# Patient Record
Sex: Male | Born: 1995
Health system: Southern US, Community
[De-identification: ages and names within clinical notes are randomized; demographics above are authoritative.]

## PROBLEM LIST (undated history)

## (undated) DIAGNOSIS — H209 Unspecified iridocyclitis: Secondary | ICD-10-CM

## (undated) DIAGNOSIS — H35359 Cystoid macular degeneration, unspecified eye: Secondary | ICD-10-CM

## (undated) DIAGNOSIS — IMO0002 Reserved for concepts with insufficient information to code with codable children: Secondary | ICD-10-CM

## (undated) DIAGNOSIS — J45909 Unspecified asthma, uncomplicated: Secondary | ICD-10-CM

## (undated) HISTORY — DX: Cystoid macular degeneration, unspecified eye: H35.359

## (undated) HISTORY — DX: Reserved for concepts with insufficient information to code with codable children: IMO0002

## (undated) HISTORY — DX: Unspecified iridocyclitis: H20.9

## (undated) HISTORY — PX: APPENDECTOMY: SHX54

## (undated) HISTORY — PX: EYE SURGERY: SHX253

---
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
## 2017-10-19 DIAGNOSIS — H3020 Posterior cyclitis, unspecified eye: Secondary | ICD-10-CM | POA: Diagnosis not present

## 2017-10-19 DIAGNOSIS — H209 Unspecified iridocyclitis: Secondary | ICD-10-CM | POA: Diagnosis not present

## 2017-11-18 DIAGNOSIS — H209 Unspecified iridocyclitis: Secondary | ICD-10-CM | POA: Diagnosis not present

## 2017-11-18 DIAGNOSIS — H3020 Posterior cyclitis, unspecified eye: Secondary | ICD-10-CM | POA: Diagnosis not present

## 2017-11-19 DIAGNOSIS — H3023 Posterior cyclitis, bilateral: Secondary | ICD-10-CM | POA: Diagnosis not present

## 2017-11-19 DIAGNOSIS — H35373 Puckering of macula, bilateral: Secondary | ICD-10-CM | POA: Diagnosis not present

## 2017-11-19 DIAGNOSIS — H209 Unspecified iridocyclitis: Secondary | ICD-10-CM | POA: Diagnosis not present

## 2017-11-19 DIAGNOSIS — H35353 Cystoid macular degeneration, bilateral: Secondary | ICD-10-CM | POA: Diagnosis not present

## 2017-12-16 DIAGNOSIS — H209 Unspecified iridocyclitis: Secondary | ICD-10-CM | POA: Diagnosis not present

## 2017-12-16 DIAGNOSIS — H3023 Posterior cyclitis, bilateral: Secondary | ICD-10-CM | POA: Diagnosis not present

## 2017-12-16 DIAGNOSIS — H3020 Posterior cyclitis, unspecified eye: Secondary | ICD-10-CM | POA: Diagnosis not present

## 2018-01-13 DIAGNOSIS — H209 Unspecified iridocyclitis: Secondary | ICD-10-CM | POA: Diagnosis not present

## 2018-01-13 DIAGNOSIS — H3023 Posterior cyclitis, bilateral: Secondary | ICD-10-CM | POA: Diagnosis not present

## 2018-01-13 DIAGNOSIS — H3020 Posterior cyclitis, unspecified eye: Secondary | ICD-10-CM | POA: Diagnosis not present

## 2018-01-17 DIAGNOSIS — Z79899 Other long term (current) drug therapy: Secondary | ICD-10-CM | POA: Diagnosis not present

## 2018-01-17 DIAGNOSIS — H2013 Chronic iridocyclitis, bilateral: Secondary | ICD-10-CM | POA: Diagnosis not present

## 2018-01-25 DIAGNOSIS — H40013 Open angle with borderline findings, low risk, bilateral: Secondary | ICD-10-CM | POA: Diagnosis not present

## 2018-01-25 DIAGNOSIS — H25013 Cortical age-related cataract, bilateral: Secondary | ICD-10-CM | POA: Diagnosis not present

## 2018-01-25 DIAGNOSIS — H209 Unspecified iridocyclitis: Secondary | ICD-10-CM | POA: Diagnosis not present

## 2018-02-10 DIAGNOSIS — H209 Unspecified iridocyclitis: Secondary | ICD-10-CM | POA: Diagnosis not present

## 2018-02-10 DIAGNOSIS — H3020 Posterior cyclitis, unspecified eye: Secondary | ICD-10-CM | POA: Diagnosis not present

## 2018-03-10 DIAGNOSIS — H209 Unspecified iridocyclitis: Secondary | ICD-10-CM | POA: Diagnosis not present

## 2018-03-10 DIAGNOSIS — H3020 Posterior cyclitis, unspecified eye: Secondary | ICD-10-CM | POA: Diagnosis not present

## 2018-03-18 DIAGNOSIS — H25013 Cortical age-related cataract, bilateral: Secondary | ICD-10-CM | POA: Diagnosis not present

## 2018-03-18 DIAGNOSIS — H35353 Cystoid macular degeneration, bilateral: Secondary | ICD-10-CM | POA: Diagnosis not present

## 2018-03-18 DIAGNOSIS — H35373 Puckering of macula, bilateral: Secondary | ICD-10-CM | POA: Diagnosis not present

## 2018-03-18 DIAGNOSIS — H209 Unspecified iridocyclitis: Secondary | ICD-10-CM | POA: Diagnosis not present

## 2018-03-18 DIAGNOSIS — H3023 Posterior cyclitis, bilateral: Secondary | ICD-10-CM | POA: Diagnosis not present

## 2018-04-04 DIAGNOSIS — Z79899 Other long term (current) drug therapy: Secondary | ICD-10-CM | POA: Diagnosis not present

## 2018-04-11 DIAGNOSIS — H209 Unspecified iridocyclitis: Secondary | ICD-10-CM | POA: Diagnosis not present

## 2018-04-11 DIAGNOSIS — H3023 Posterior cyclitis, bilateral: Secondary | ICD-10-CM | POA: Diagnosis not present

## 2018-05-10 DIAGNOSIS — H209 Unspecified iridocyclitis: Secondary | ICD-10-CM | POA: Diagnosis not present

## 2018-05-10 DIAGNOSIS — H3020 Posterior cyclitis, unspecified eye: Secondary | ICD-10-CM | POA: Diagnosis not present

## 2018-06-07 DIAGNOSIS — T63483A Toxic effect of venom of other arthropod, assault, initial encounter: Secondary | ICD-10-CM | POA: Diagnosis not present

## 2018-06-09 DIAGNOSIS — H3020 Posterior cyclitis, unspecified eye: Secondary | ICD-10-CM | POA: Diagnosis not present

## 2018-06-09 DIAGNOSIS — H209 Unspecified iridocyclitis: Secondary | ICD-10-CM | POA: Diagnosis not present

## 2018-07-08 DIAGNOSIS — H209 Unspecified iridocyclitis: Secondary | ICD-10-CM | POA: Diagnosis not present

## 2018-07-08 DIAGNOSIS — H3020 Posterior cyclitis, unspecified eye: Secondary | ICD-10-CM | POA: Diagnosis not present

## 2018-07-18 DIAGNOSIS — Z79899 Other long term (current) drug therapy: Secondary | ICD-10-CM | POA: Diagnosis not present

## 2018-07-18 DIAGNOSIS — H209 Unspecified iridocyclitis: Secondary | ICD-10-CM | POA: Diagnosis not present

## 2018-07-22 DIAGNOSIS — H25013 Cortical age-related cataract, bilateral: Secondary | ICD-10-CM | POA: Diagnosis not present

## 2018-07-22 DIAGNOSIS — H3023 Posterior cyclitis, bilateral: Secondary | ICD-10-CM | POA: Diagnosis not present

## 2018-07-22 DIAGNOSIS — H35353 Cystoid macular degeneration, bilateral: Secondary | ICD-10-CM | POA: Diagnosis not present

## 2018-07-22 DIAGNOSIS — H35373 Puckering of macula, bilateral: Secondary | ICD-10-CM | POA: Diagnosis not present

## 2018-08-15 DIAGNOSIS — H3020 Posterior cyclitis, unspecified eye: Secondary | ICD-10-CM | POA: Diagnosis not present

## 2018-08-15 DIAGNOSIS — H209 Unspecified iridocyclitis: Secondary | ICD-10-CM | POA: Diagnosis not present

## 2018-09-20 DIAGNOSIS — H35373 Puckering of macula, bilateral: Secondary | ICD-10-CM | POA: Diagnosis not present

## 2018-09-20 DIAGNOSIS — H3020 Posterior cyclitis, unspecified eye: Secondary | ICD-10-CM | POA: Diagnosis not present

## 2018-09-20 DIAGNOSIS — H3023 Posterior cyclitis, bilateral: Secondary | ICD-10-CM | POA: Diagnosis not present

## 2018-09-20 DIAGNOSIS — H209 Unspecified iridocyclitis: Secondary | ICD-10-CM | POA: Diagnosis not present

## 2018-10-27 DIAGNOSIS — H209 Unspecified iridocyclitis: Secondary | ICD-10-CM | POA: Diagnosis not present

## 2018-10-27 DIAGNOSIS — H3020 Posterior cyclitis, unspecified eye: Secondary | ICD-10-CM | POA: Diagnosis not present

## 2018-10-31 DIAGNOSIS — Z79899 Other long term (current) drug therapy: Secondary | ICD-10-CM | POA: Diagnosis not present

## 2018-10-31 DIAGNOSIS — H209 Unspecified iridocyclitis: Secondary | ICD-10-CM | POA: Diagnosis not present

## 2018-11-25 DIAGNOSIS — H35353 Cystoid macular degeneration, bilateral: Secondary | ICD-10-CM | POA: Diagnosis not present

## 2018-11-25 DIAGNOSIS — H25013 Cortical age-related cataract, bilateral: Secondary | ICD-10-CM | POA: Diagnosis not present

## 2018-11-25 DIAGNOSIS — H35373 Puckering of macula, bilateral: Secondary | ICD-10-CM | POA: Diagnosis not present

## 2018-11-25 DIAGNOSIS — H3023 Posterior cyclitis, bilateral: Secondary | ICD-10-CM | POA: Diagnosis not present

## 2018-11-29 DIAGNOSIS — H209 Unspecified iridocyclitis: Secondary | ICD-10-CM | POA: Diagnosis not present

## 2018-11-29 DIAGNOSIS — H3020 Posterior cyclitis, unspecified eye: Secondary | ICD-10-CM | POA: Diagnosis not present

## 2019-01-03 DIAGNOSIS — H3023 Posterior cyclitis, bilateral: Secondary | ICD-10-CM | POA: Diagnosis not present

## 2019-01-03 DIAGNOSIS — H35373 Puckering of macula, bilateral: Secondary | ICD-10-CM | POA: Diagnosis not present

## 2019-01-03 DIAGNOSIS — H3020 Posterior cyclitis, unspecified eye: Secondary | ICD-10-CM | POA: Diagnosis not present

## 2019-01-03 DIAGNOSIS — H209 Unspecified iridocyclitis: Secondary | ICD-10-CM | POA: Diagnosis not present

## 2019-02-07 DIAGNOSIS — H209 Unspecified iridocyclitis: Secondary | ICD-10-CM | POA: Diagnosis not present

## 2019-02-07 DIAGNOSIS — H3020 Posterior cyclitis, unspecified eye: Secondary | ICD-10-CM | POA: Diagnosis not present

## 2019-03-14 DIAGNOSIS — H209 Unspecified iridocyclitis: Secondary | ICD-10-CM | POA: Diagnosis not present

## 2019-03-20 DIAGNOSIS — H209 Unspecified iridocyclitis: Secondary | ICD-10-CM | POA: Diagnosis not present

## 2019-04-19 DIAGNOSIS — H35373 Puckering of macula, bilateral: Secondary | ICD-10-CM | POA: Diagnosis not present

## 2019-04-19 DIAGNOSIS — H209 Unspecified iridocyclitis: Secondary | ICD-10-CM | POA: Diagnosis not present

## 2019-04-19 DIAGNOSIS — H3023 Posterior cyclitis, bilateral: Secondary | ICD-10-CM | POA: Diagnosis not present

## 2019-05-24 DIAGNOSIS — H209 Unspecified iridocyclitis: Secondary | ICD-10-CM | POA: Diagnosis not present

## 2019-06-16 DIAGNOSIS — H3023 Posterior cyclitis, bilateral: Secondary | ICD-10-CM | POA: Diagnosis not present

## 2019-06-16 DIAGNOSIS — H35373 Puckering of macula, bilateral: Secondary | ICD-10-CM | POA: Diagnosis not present

## 2019-06-16 DIAGNOSIS — H25013 Cortical age-related cataract, bilateral: Secondary | ICD-10-CM | POA: Diagnosis not present

## 2019-06-16 DIAGNOSIS — H35353 Cystoid macular degeneration, bilateral: Secondary | ICD-10-CM | POA: Diagnosis not present

## 2019-06-27 ENCOUNTER — Telehealth: Payer: Self-pay | Admitting: Family Medicine

## 2019-06-27 NOTE — Telephone Encounter (Signed)
Pt needs copy of immunization records for school please call when ready.

## 2019-06-28 DIAGNOSIS — H209 Unspecified iridocyclitis: Secondary | ICD-10-CM | POA: Diagnosis not present

## 2019-06-28 NOTE — Telephone Encounter (Signed)
Left up front and pt aware

## 2019-06-28 NOTE — Telephone Encounter (Signed)
Pt wanted paperwork emailed to him - paperwork was emailed to pt through my office email

## 2019-07-06 ENCOUNTER — Other Ambulatory Visit: Payer: Self-pay

## 2019-07-06 ENCOUNTER — Ambulatory Visit (INDEPENDENT_AMBULATORY_CARE_PROVIDER_SITE_OTHER): Payer: BC Managed Care – PPO | Admitting: *Deleted

## 2019-07-06 DIAGNOSIS — Z23 Encounter for immunization: Secondary | ICD-10-CM | POA: Diagnosis not present

## 2019-07-17 DIAGNOSIS — H209 Unspecified iridocyclitis: Secondary | ICD-10-CM | POA: Diagnosis not present

## 2019-07-17 DIAGNOSIS — Z79899 Other long term (current) drug therapy: Secondary | ICD-10-CM | POA: Diagnosis not present

## 2019-08-02 DIAGNOSIS — H209 Unspecified iridocyclitis: Secondary | ICD-10-CM | POA: Diagnosis not present

## 2019-09-06 DIAGNOSIS — H209 Unspecified iridocyclitis: Secondary | ICD-10-CM | POA: Diagnosis not present

## 2019-10-11 DIAGNOSIS — H209 Unspecified iridocyclitis: Secondary | ICD-10-CM | POA: Diagnosis not present

## 2019-10-13 DIAGNOSIS — H3023 Posterior cyclitis, bilateral: Secondary | ICD-10-CM | POA: Diagnosis not present

## 2019-10-13 DIAGNOSIS — H40013 Open angle with borderline findings, low risk, bilateral: Secondary | ICD-10-CM | POA: Diagnosis not present

## 2019-10-13 DIAGNOSIS — H35353 Cystoid macular degeneration, bilateral: Secondary | ICD-10-CM | POA: Diagnosis not present

## 2019-10-16 DIAGNOSIS — H209 Unspecified iridocyclitis: Secondary | ICD-10-CM | POA: Diagnosis not present

## 2019-11-20 DIAGNOSIS — H209 Unspecified iridocyclitis: Secondary | ICD-10-CM | POA: Diagnosis not present

## 2020-08-07 ENCOUNTER — Other Ambulatory Visit: Payer: Self-pay

## 2020-11-11 ENCOUNTER — Other Ambulatory Visit: Payer: Self-pay

## 2020-11-11 ENCOUNTER — Ambulatory Visit (INDEPENDENT_AMBULATORY_CARE_PROVIDER_SITE_OTHER): Payer: 59 | Admitting: Family Medicine

## 2020-11-11 VITALS — BP 130/78 | HR 81 | Temp 98.3°F | Ht 74.0 in | Wt 221.0 lb

## 2020-11-11 DIAGNOSIS — Z7689 Persons encountering health services in other specified circumstances: Secondary | ICD-10-CM | POA: Diagnosis not present

## 2020-11-12 NOTE — Progress Notes (Signed)
Subjective:    Patient ID: Edward Sherman, male    DOB: Feb 16, 1996, 24 y.o.   MRN: [id number]  HPI  Patient has not been seen here in several years.  He is here today to reestablish care.  He has no concerns today.  He receives the majority of his care at [facility].  He sees ophthalmology at [facility] due to uveitis.  He is on a combination of several immunosuppressive drugs including methotrexate, Remicade, and leucovorin.  He states that his eye issues have been relatively well controlled over the last 2 years.  He states that his doctor at [facility] is wanting to maintain stability for 2 years before trying to start to wean the patient down on his medication.  He denies any other concerns.  However he has not had his flu shot.  He is not had his Covid vaccine.  He has not had the Covid virus himself.  He recently had lab work at [facility] including a CBC that was completely normal and a CMP that was significant only for a blood sugar of 58. Past Medical History:  Diagnosis Date   Asthma    childhood   Cystoid macular edema    Posterior subcapsular cataract    Uveitis    Past Surgical History:  Procedure Laterality Date   APPENDECTOMY     EYE SURGERY  laser eye surgery   Current Outpatient Medications on File Prior to Visit  Medication Sig Dispense Refill   B-D TB SYRINGE 1CC/27GX1/2" 27G X 1/2" 1 ML MISC      Calcium Carbonate (CALTRATE 600 PO) Take 1 tablet by mouth 2 (two) times daily.     folic acid (FOLVITE) 1 MG tablet Take 1 mg by mouth 2 (two) times daily.     inFLIXimab (REMICADE) 100 MG injection Inject into the vein.     leucovorin (WELLCOVORIN) 5 MG tablet      Methotrexate, PF, 20 MG/0.4ML SOAJ Inject 0.8 mg into the skin once a week.     Multiple Vitamin (MULTIVITAMIN WITH MINERALS) TABS tablet Take 1 tablet by mouth daily.     No current facility-administered medications on file prior to visit.   Allergies  Allergen Reactions   Wasp Venom Swelling   Sulfa  Antibiotics Rash   Social History   Socioeconomic History   Marital status: Single    Spouse name: Not on file   Number of children: Not on file   Years of education: Not on file   Highest education level: Not on file  Occupational History   Not on file  Tobacco Use   Smoking status: Never Smoker   Smokeless tobacco: Not on file  Substance and Sexual Activity   Alcohol use: No   Drug use: No   Sexual activity: Not on file  Other Topics Concern   Not on file  Social History Narrative   Not on file   Social Determinants of Health   Financial Resource Strain: Not on file  Food Insecurity: Not on file  Transportation Needs: Not on file  Physical Activity: Not on file  Stress: Not on file  Social Connections: Not on file  Intimate Partner Violence: Not on file      Review of Systems  All other systems reviewed and are negative.      Objective:   Physical Exam Vitals reviewed.  Constitutional:      General: He is not in acute distress.    Appearance: Normal appearance. He is  normal weight. He is not ill-appearing, toxic-appearing or diaphoretic.  HENT:     Nose: Nose normal. No congestion or rhinorrhea.  Cardiovascular:     Rate and Rhythm: Normal rate and regular rhythm.     Pulses: Normal pulses.     Heart sounds: Normal heart sounds. No murmur heard. No friction rub. No gallop.   Pulmonary:     Effort: Pulmonary effort is normal. No respiratory distress.     Breath sounds: Normal breath sounds. No stridor. No wheezing, rhonchi or rales.  Chest:     Chest wall: No tenderness.  Abdominal:     General: Bowel sounds are normal.     Palpations: Abdomen is soft.  Musculoskeletal:     Right lower leg: No edema.     Left lower leg: No edema.  Skin:    Findings: No rash.  Neurological:     General: No focal deficit present.     Mental Status: He is alert and oriented to person, place, and time.     Cranial Nerves: No cranial nerve deficit.      Motor: No weakness.     Coordination: Coordination normal.     Gait: Gait normal.  Psychiatric:        Mood and Affect: Mood normal.        Behavior: Behavior normal.        Thought Content: Thought content normal.        Judgment: Judgment normal.           Assessment & Plan:  Establishing care with new doctor, encounter for  At present, the patient has no active issues other than his uveitis which is being managed by his ophthalmologist at [facility].  He primarily wanted to reestablish care here today.  His lab work was recently obtained today which looks excellent.  Therefore I spent our entire encounter today trying to encourage the patient to get the Covid vaccine.  Although he is extremely young and healthy he is immunocompromise due to the medications he takes.  These are systemically acting medications and would put him at higher risk for complications should he acquire Covid.  Given the current increase in cases, I strongly encourage the patient to get the Covid vaccine.  Also recommended the flu shot.  Patient states that he will consider.

## 2024-02-24 ENCOUNTER — Emergency Department (HOSPITAL_COMMUNITY)
Admission: EM | Admit: 2024-02-24 | Discharge: 2024-02-24 | Disposition: A | Discharge status: Home / Self Care | Attending: Emergency Medicine | Admitting: Emergency Medicine

## 2024-02-24 ENCOUNTER — Ambulatory Visit: Payer: Self-pay

## 2024-02-24 ENCOUNTER — Emergency Department (HOSPITAL_COMMUNITY)

## 2024-02-24 DIAGNOSIS — D72829 Elevated white blood cell count, unspecified: Secondary | ICD-10-CM | POA: Insufficient documentation

## 2024-02-24 DIAGNOSIS — R0789 Other chest pain: Secondary | ICD-10-CM | POA: Insufficient documentation

## 2024-02-24 DIAGNOSIS — R209 Unspecified disturbances of skin sensation: Secondary | ICD-10-CM | POA: Diagnosis not present

## 2024-02-24 LAB — BASIC METABOLIC PANEL WITH GFR
Anion gap: 8 (ref 5–15)
BUN: 15 mg/dL (ref 6–20)
CO2: 25 mmol/L (ref 22–32)
Calcium: 9.6 mg/dL (ref 8.9–10.3)
Chloride: 104 mmol/L (ref 98–111)
Creatinine, Ser: 0.95 mg/dL (ref 0.61–1.24)
GFR, Estimated: >60 mL/min (ref >60)
Glucose, Bld: 97 mg/dL (ref 70–99)
Potassium: 4 mmol/L (ref 3.5–5.1)
Sodium: 137 mmol/L (ref 135–145)

## 2024-02-24 LAB — CBC
HCT: 46.4 % (ref 39.0–52.0)
Hemoglobin: 15.7 g/dL (ref 13.0–17.0)
MCH: 29.1 pg (ref 26.0–34.0)
MCHC: 33.8 g/dL (ref 30.0–36.0)
MCV: 86.1 fL (ref 80.0–100.0)
Platelets: 249 10*3/uL (ref 150–400)
RBC: 5.39 MIL/uL (ref 4.22–5.81)
RDW: 11.9 % (ref 11.5–15.5)
WBC: 12.6 10*3/uL — ABNORMAL HIGH (ref 4.0–10.5)
nRBC: 0 % (ref 0.0–0.2)

## 2024-02-24 LAB — TROPONIN I (HIGH SENSITIVITY)
Troponin I (High Sensitivity): 3 ng/L (ref <18)
Troponin I (High Sensitivity): 4 ng/L (ref <18)

## 2024-02-24 NOTE — Discharge Instructions (Signed)
 Your workup today shows no significant abnormalities.  I suspect that this is all chest wall pain from your vigorous activity this weekend. You have been diagnosed by your caregiver as having chest wall pain. You may take Motrin and Tylenol for chest discomfort.  SEEK IMMEDIATE MEDICAL ATTENTION IF: You develop a fever.  Your chest pains become severe or intolerable.  You develop new, unexplained symptoms (problems).  You develop shortness of breath, nausea, vomiting, sweating or feel light headed.  You develop a new cough or you cough up blood.

## 2024-02-24 NOTE — Telephone Encounter (Signed)
  Chief Complaint: chest pain  Symptoms: numbness in right arm/fingers; pain   Disposition: [x] ED /[] Urgent Care (no appt availability in office) / [] Appointment(In office/virtual)/ []  [facility]/ [] Home Care/ [] Refused Recommended Disposition /[] [facility]/ []  Follow-up with PCP Additional Notes: Pt calling with right sided chest pain with numbness down to fingers on right arm. Pt stated he had chest pains on left side 2 days ago ,but that has resolved. Chest pains started on Tuesday morning. Pt was doing manual labor over the weekend. Pt was installing fences and working in garden. Pt rated pain 4/10. Pt stated it's hurts to take deep breath in . RN advised pt to go to ED. RN advised pt to call back and set up new PCP appt.  Pt verbalized understadning.            Copied from CRM [id number]. Topic: Clinical - Red Word Triage >> Feb 24, 2024  9:51 AM Clayton Bibles wrote: Red Word that prompted transfer to Nurse Triage:  Last night he was having right side chest pain. The pain went down is right arm and he did have numbness in his arm and fingers. Reason for Disposition  [1] Chest pain lasts > 5 minutes AND [2] occurred in past 3 days (72 hours) (Exception: Feels exactly the same as previously diagnosed heartburn and has accompanying sour taste in mouth.)  Answer Assessment - Initial Assessment Questions 1. LOCATION: "Where does it hurt?"       Right side of chest  2. RADIATION: "Does the pain go anywhere else?" (e.g., into neck, jaw, arms, back)     Radiates to right arm 3. ONSET: "When did the chest pain begin?" (Minutes, hours or days)      Tuesday morning  4. PATTERN: "Does the pain come and go, or has it been constant since it started?"  "Does it get worse with exertion?"      Constant   6. SEVERITY: "How bad is the pain?"  (e.g., Scale 1-10; mild, moderate, or severe)    - MILD (1-3): doesn't interfere with normal activities     - MODERATE (4-7): interferes  with normal activities or awakens from sleep    - SEVERE (8-10): excruciating pain, unable to do any normal activities       4 7. CARDIAC RISK FACTORS: "Do you have any history of heart problems or risk factors for heart disease?" (e.g., angina, prior heart attack; diabetes, high blood pressure, high cholesterol, smoker, or strong family history of heart disease)     denies 8. PULMONARY RISK FACTORS: "Do you have any history of lung disease?"  (e.g., blood clots in lung, asthma, emphysema, birth control pills)     Denies  9. CAUSE: "What do you think is causing the chest pain?"     Manual labor  10. OTHER SYMPTOMS: "Do you have any other symptoms?" (e.g., dizziness, nausea, vomiting, sweating, fever, difficulty breathing, cough)       Numbness down right arm to fingers  Protocols used: Chest Pain-A-AH

## 2024-02-24 NOTE — ED Triage Notes (Signed)
 Pt states that over the weekend he was exerting himself physically with building a fence and also gardening. Pt states that since then he's been having chest pain R>L. Pt also states that he's been having numbness down his R arm into his fourth and fifth fingers.

## 2024-02-24 NOTE — ED Provider Notes (Signed)
 [place] EMERGENCY DEPARTMENT AT [facility] Provider Note   CSN: [id number] Arrival date & time: 02/24/24  1136     History  Chief Complaint  Patient presents with   Chest Pain    Edward Sherman is a 28 y.o. male who presents emergency department with chief complaint of chest pain.  He has a past medical history of uveitis and is on infliximab infusions.  Patient also reports that over the weekend he helped put up a fence and killed his yard so he was doing a lot of heavy work.  That evening he began having pain across his chest worse on the right side.  He states it is worse when he leans forward and when he lies back flat.  He denies any shortness of breath wheezing nausea vomiting or diaphoresis.  He has no cardiac risk factors.  He is now having a little bit of numbness in the ulnar distribution of his right arm into his fourth and fifth digits but denies any weakness.   Chest Pain      Home Medications Prior to Admission medications   Medication Sig Start Date End Date Taking? Authorizing Provider  B-D TB SYRINGE 1CC/27GX1/2" 27G X 1/2" 1 ML MISC  02/28/14   [provider]  Calcium Carbonate (CALTRATE 600 PO) Take 1 tablet by mouth 2 (two) times daily.    [provider]  folic acid (FOLVITE) 1 MG tablet Take 1 mg by mouth 2 (two) times daily.    [provider]  inFLIXimab (REMICADE) 100 MG injection Inject into the vein.    [provider]  leucovorin (WELLCOVORIN) 5 MG tablet  02/28/14   [provider]  Methotrexate, PF, 20 MG/0.4ML SOAJ Inject 0.8 mg into the skin once a week.    [provider]  Multiple Vitamin (MULTIVITAMIN WITH MINERALS) TABS tablet Take 1 tablet by mouth daily.    [provider]      Allergies    Wasp venom and Sulfa antibiotics    Review of Systems   Review of Systems  Cardiovascular:  Positive for chest pain.    Physical Exam Updated Vital Signs BP 134/86 (BP  Location: Left Arm)   Pulse 83   Temp 99 F (37.2 C) (Oral)   Resp 16   SpO2 99%  Physical Exam Vitals and nursing note reviewed.  Constitutional:      General: He is not in acute distress.    Appearance: He is well-developed. He is not diaphoretic.  HENT:     Head: Normocephalic and atraumatic.  Eyes:     General: No scleral icterus.    Conjunctiva/sclera: Conjunctivae normal.  Cardiovascular:     Rate and Rhythm: Normal rate and regular rhythm.     Heart sounds: Normal heart sounds.  Pulmonary:     Effort: Pulmonary effort is normal. No respiratory distress.     Breath sounds: Normal breath sounds.  Chest:    Abdominal:     Palpations: Abdomen is soft.     Tenderness: There is no abdominal tenderness.  Musculoskeletal:     Cervical back: Normal range of motion and neck supple.  Skin:    General: Skin is warm and dry.  Neurological:     Mental Status: He is alert.  Psychiatric:        Behavior: Behavior normal.     ED Results / Procedures / Treatments   Labs (all labs ordered are listed, but only abnormal  results are displayed) Labs Reviewed  CBC - Abnormal; Notable for the following components:      Result Value   WBC 12.6 (*)    All other components within normal limits  BASIC METABOLIC PANEL WITH GFR  TROPONIN I (HIGH SENSITIVITY)  TROPONIN I (HIGH SENSITIVITY)    EKG EKG Interpretation Date/Time:  Thursday February 24 2024 11:42:14 EDT Ventricular Rate:  95 PR Interval:  140 QRS Duration:  92 QT Interval:  343 QTC Calculation: 432 R Axis:   106  Text Interpretation: Sinus rhythm Borderline right axis deviation Borderline T abnormalities, inferior leads ST elevation, consider lateral injury Confirmed by Bethann Berkshire [phone]) on 02/24/2024 2:14:26 PM  Radiology No results found.   Procedures Procedures    Medications Ordered in ED Medications - No data to display  ED Course/ Medical Decision Making/ A&P                                  Medical Decision Making Given the large differential diagnosis for Edward Sherman, the decision making in this case is of high complexity.  After evaluating all of the data points in this case, the presentation of Edward Sherman is NOT consistent with Acute Coronary Syndrome (ACS) and/or myocardial ischemia, pulmonary embolism, aortic dissection; Borhaave's, significant arrythmia, pneumothorax, cardiac tamponade, or other emergent cardiopulmonary condition.  Further, the presentation of Edward Sherman is NOT consistent with pericarditis, myocarditis, cholecystitis, pancreatitis, mediastinitis, endocarditis, new valvular disease.  Additionally, the presentation of Edward Sherman NOT consistent with flail chest, cardiac contusion, ARDS, or significant intra-thoracic or intra-abdominal bleeding.  Moreover, this presentation is NOT consistent with pneumonia, sepsis, or pyelonephritis.   Strict return and follow-up precautions have been given by me personally or by detailed written instruction given verbally by nursing staff using the teach back method to the patient/family/caregiver(s).  Data Reviewed/Counseling: I have reviewed the patient's vital signs, nursing notes, and other relevant tests/information. I had a detailed discussion regarding the historical points, exam findings, and any diagnostic results supporting the discharge diagnosis. I also discussed the need for outpatient follow-up and the need to return to the ED if symptoms worsen or if there are any questions or concerns that arise at home.    Amount and/or Complexity of Data Reviewed Labs: ordered.    Details: Troponin negative x2 Radiology: ordered and independent interpretation performed.    Details: I personally visualized and interpreted the images using our PACS system. Acute findings include:  No acute findings Wbc slightly elevated  ECG/medicine tests:     Details: EKG shows sinus rhythm at a rate of  95          Final Clinical Impression(s) / ED Diagnoses Final diagnoses:  Chest wall pain    Rx / DC Orders ED Discharge Orders     None         Arthor Captain, PA-C 02/26/24 1630    Bethann Berkshire, MD 02/27/24 1135

## 2024-07-21 ENCOUNTER — Other Ambulatory Visit: Payer: Self-pay | Admitting: Ophthalmology

## 2024-07-21 DIAGNOSIS — H3023 Posterior cyclitis, bilateral: Secondary | ICD-10-CM

## 2024-07-25 ENCOUNTER — Other Ambulatory Visit: Payer: Self-pay | Admitting: Ophthalmology

## 2024-07-25 DIAGNOSIS — H3023 Posterior cyclitis, bilateral: Secondary | ICD-10-CM

## 2024-08-01 ENCOUNTER — Encounter: Payer: Self-pay | Admitting: Ophthalmology

## 2024-08-10 ENCOUNTER — Ambulatory Visit
Admission: RE | Admit: 2024-08-10 | Discharge: 2024-08-10 | Disposition: A | Discharge status: Home / Self Care | Source: Ambulatory Visit | Attending: Ophthalmology | Admitting: Ophthalmology

## 2024-08-10 DIAGNOSIS — H3023 Posterior cyclitis, bilateral: Secondary | ICD-10-CM

## 2024-08-10 MED ORDER — GADOPICLENOL 0.5 MMOL/ML IV SOLN
10.0000 mL | Freq: Once | INTRAVENOUS | Status: AC | PRN
  Administered 2024-08-10: 10 mL via INTRAVENOUS

## 2024-08-11 ENCOUNTER — Other Ambulatory Visit

## 2024-11-29 ENCOUNTER — Ambulatory Visit: Admitting: Neurology
